# Patient Record
Sex: Male | Born: 1990 | Race: White | Smoking: Former smoker
Health system: Southern US, Community
[De-identification: ages and names within clinical notes are randomized; demographics above are authoritative.]

## PROBLEM LIST (undated history)

## (undated) DIAGNOSIS — N83209 Unspecified ovarian cyst, unspecified side: Secondary | ICD-10-CM

## (undated) DIAGNOSIS — E279 Disorder of adrenal gland, unspecified: Secondary | ICD-10-CM

## (undated) HISTORY — DX: Unspecified ovarian cyst, unspecified side: N83.209

## (undated) HISTORY — PX: TONSILECTOMY, ADENOIDECTOMY, BILATERAL MYRINGOTOMY AND TUBES: SHX2538

## (undated) HISTORY — PX: ADRENALECTOMY: SHX876

## (undated) HISTORY — DX: Disorder of adrenal gland, unspecified: E27.9

---

## 2012-10-16 ENCOUNTER — Other Ambulatory Visit: Payer: Self-pay | Admitting: Sports Medicine

## 2012-10-16 DIAGNOSIS — M25511 Pain in right shoulder: Secondary | ICD-10-CM

## 2012-10-24 ENCOUNTER — Ambulatory Visit
Admission: RE | Admit: 2012-10-24 | Discharge: 2012-10-24 | Disposition: A | Discharge status: Home / Self Care | Payer: BC Managed Care – PPO | Source: Ambulatory Visit | Attending: Sports Medicine | Admitting: Sports Medicine

## 2012-10-24 DIAGNOSIS — M25511 Pain in right shoulder: Secondary | ICD-10-CM

## 2012-10-24 MED ORDER — IOHEXOL 180 MG/ML  SOLN
15.0000 mL | Freq: Once | INTRAMUSCULAR | Status: AC | PRN
  Administered 2012-10-24: 15 mL via INTRA_ARTICULAR

## 2022-06-28 ENCOUNTER — Ambulatory Visit (INDEPENDENT_AMBULATORY_CARE_PROVIDER_SITE_OTHER): Payer: 59 | Admitting: Pulmonary Disease

## 2022-06-28 ENCOUNTER — Encounter: Payer: Self-pay | Admitting: Pulmonary Disease

## 2022-06-28 VITALS — BP 110/90 | HR 81 | Ht 68.0 in | Wt 238.4 lb

## 2022-06-28 DIAGNOSIS — R053 Chronic cough: Secondary | ICD-10-CM | POA: Diagnosis not present

## 2022-06-28 DIAGNOSIS — R0602 Shortness of breath: Secondary | ICD-10-CM | POA: Diagnosis not present

## 2022-06-28 NOTE — Progress Notes (Signed)
Synopsis: Referred in March 2024 for pulmonary nodule, abnormal CT chest by Sabra Heck, [place], [place]  Subjective:   PATIENT ID: David Moon GENDER: male DOB: [birth year], MRN: [id number]  Chief Complaint  Patient presents with   Consult    Pulmonary nodule    This is a 32 year old transgender male to male, current every day smoker, 3 packs of cigarettes per week.Patient was seen in the emergency department after having a endoscopy that was complicated by having a nosebleed after what sounds like having a nasal trumpet in during the anesthesia.  He was having trouble breathing therefore was seen in the ER.  Additional medical history includes a history of a cholecystectomy and ERCP in 2011, a laparoscopic adrenalectomy in 2018 history of tonsillectomy and adenoidectomy and myringotomy tubes.  Family history includes mother with atrial fibrillation, hyperlipidemia, sleep apnea, father with sleep apnea and a stroke.,  Maternal grandmother with lung cancer and a maternal grandfather with lung cancer.    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[place])    Ovarian cyst      Family History  Problem Relation Age of Onset   Hypertension Mother    Atrial fibrillation Mother    Lung cancer Maternal Grandmother    Lung cancer Paternal Grandmother      Past Surgical History:  Procedure Laterality Date   ADRENALECTOMY     TONSILECTOMY, ADENOIDECTOMY, BILATERAL MYRINGOTOMY AND TUBES      Social History   Socioeconomic History   Marital status: Divorced    Spouse name: Not on file   Number of children: Not on file   Years of education: Not on file   Highest education level: Not on file  Occupational History   Not on file  Tobacco Use   Smoking status: Every Day    Types: Cigarettes   Smokeless tobacco: Never   Tobacco comments:    Smokes 3 packs of cigarettes a week. 06/28/2022 Tay  Substance and Sexual Activity   Alcohol use: Not on file   Drug use: Not  on file   Sexual activity: Not on file  Other Topics Concern   Not on file  Social History Narrative   Not on file   Social Determinants of Health   Financial Resource Strain: Not on file  Food Insecurity: Not on file  Transportation Needs: Not on file  Physical Activity: Not on file  Stress: Not on file  Social Connections: Not on file  Intimate Partner Violence: Not on file     Not on File   Outpatient Medications Prior to Visit  Medication Sig Dispense Refill   buPROPion (WELLBUTRIN SR) 150 MG 12 hr tablet Take 150 mg by mouth 2 (two) times daily.     gabapentin (NEURONTIN) 300 MG capsule Take 300 mg by mouth 2 (two) times daily.     testosterone cypionate (DEPOTESTOSTERONE CYPIONATE) 200 MG/ML injection Inject 200 mg into the muscle every 14 (fourteen) days.     No facility-administered medications prior to visit.    Review of Systems  Constitutional:  Negative for chills, fever, malaise/fatigue and weight loss.  HENT:  Negative for hearing loss, sore throat and tinnitus.   Eyes:  Negative for blurred vision and double vision.  Respiratory:  Positive for cough. Negative for hemoptysis, sputum production, shortness of breath, wheezing and stridor.   Cardiovascular:  Negative for chest pain, palpitations, orthopnea, leg swelling and PND.  Gastrointestinal:  Negative for abdominal pain, constipation,  diarrhea, heartburn, nausea and vomiting.  Genitourinary:  Negative for dysuria, hematuria and urgency.  Musculoskeletal:  Positive for joint pain and myalgias.  Skin:  Negative for itching and rash.  Neurological:  Negative for dizziness, tingling, weakness and headaches.  Endo/Heme/Allergies:  Negative for environmental allergies. Does not bruise/bleed easily.  Psychiatric/Behavioral:  Negative for depression. The patient is not nervous/anxious and does not have insomnia.   All other systems reviewed and are negative.    Objective:  Physical Exam Vitals reviewed.   Constitutional:      General: She is not in acute distress.    Appearance: She is well-developed.  HENT:     Head: Normocephalic and atraumatic.  Eyes:     General: No scleral icterus.    Conjunctiva/sclera: Conjunctivae normal.     Pupils: Pupils are equal, round, and reactive to light.  Neck:     Vascular: No JVD.     Trachea: No tracheal deviation.  Cardiovascular:     Rate and Rhythm: Normal rate and regular rhythm.     Heart sounds: Normal heart sounds. No murmur heard. Pulmonary:     Effort: Pulmonary effort is normal. No tachypnea, accessory muscle usage or respiratory distress.     Breath sounds: No stridor. No wheezing, rhonchi or rales.  Abdominal:     General: There is no distension.     Palpations: Abdomen is soft.     Tenderness: There is no abdominal tenderness.  Musculoskeletal:        General: No tenderness.     Cervical back: Neck supple.  Lymphadenopathy:     Cervical: No cervical adenopathy.  Skin:    General: Skin is warm and dry.     Capillary Refill: Capillary refill takes less than 2 seconds.     Findings: No rash.  Neurological:     Mental Status: She is alert and oriented to person, place, and time.  Psychiatric:        Behavior: Behavior normal.      Vitals:   06/28/22 1319  BP: (!) 110/90  Pulse: 81  SpO2: 96%  Weight: 238 lb 6.4 oz (108.1 kg)  Height: 5\' 8"  (1.727 m)   96% on RA BMI Readings from Last 3 Encounters:  06/28/22 36.25 kg/m   Wt Readings from Last 3 Encounters:  06/28/22 238 lb 6.4 oz (108.1 kg)     CBC No results found for: "WBC", "RBC", "HGB", "HCT", "PLT", "MCV", "MCH", "MCHC", "RDW", "LYMPHSABS", "MONOABS", "EOSABS", "BASOSABS"   Chest Imaging: CT chest [facility] January 2024: Bilateral mosaicism, stable lung nodule since 2018. The patient's images have been independently reviewed by me.    Pulmonary Functions Testing Results:     No data to display          FeNO:   Pathology:    Echocardiogram:   Heart Catheterization:     Assessment & Plan:     ICD-10-CM   1. SOB (shortness of breath)  R06.02 Pulmonary Function Test    CT CHEST HIGH RESOLUTION    2. Chronic cough  R05.3 Pulmonary Function Test    CT CHEST HIGH RESOLUTION      Discussion:  This is a 32 year old transgender male to male.  Found to have abnormal CT imaging with bilateral mosaicism and a stable pulmonary nodule.  He has ongoing shortness of breath and cough.  Plan: I think next best step is to have pulmonary function test. Patient also needs to stop smoking. Since he has  bilateral mosaicism we could be dealing with small airway disease or bronchiolitis related to smoking. I think an HRCT of the chest would also help. We will have repeat CT imaging in 3 months after previous image which will put this in April 2024. Patient to follow-up with [place] in a couple of weeks after the HRCT and PFTs are complete.   Current Outpatient Medications:    buPROPion (WELLBUTRIN SR) 150 MG 12 hr tablet, Take 150 mg by mouth 2 (two) times daily., Disp: , Rfl:    gabapentin (NEURONTIN) 300 MG capsule, Take 300 mg by mouth 2 (two) times daily., Disp: , Rfl:    testosterone cypionate (DEPOTESTOSTERONE CYPIONATE) 200 MG/ML injection, Inject 200 mg into the muscle every 14 (fourteen) days., Disp: , Rfl:    Garner Nash, DO [facility] 06/28/2022 2:00 PM

## 2022-06-28 NOTE — Patient Instructions (Signed)
Thank you for visiting Dr. Valeta Harms at [facility]. Today we recommend the following:  Orders Placed This Encounter  Procedures   CT CHEST HIGH RESOLUTION   Pulmonary Function Test   Return in about 6 weeks (around 08/11/2022) for with APP or Dr. Valeta Harms, after PFTs and HRCT .    Please do your part to reduce the spread of COVID-19.

## 2022-07-20 ENCOUNTER — Ambulatory Visit
Admission: RE | Admit: 2022-07-20 | Discharge: 2022-07-20 | Disposition: A | Discharge status: Home / Self Care | Payer: 59 | Source: Ambulatory Visit | Attending: Internal Medicine | Admitting: Internal Medicine

## 2022-07-20 ENCOUNTER — Other Ambulatory Visit: Payer: Self-pay | Admitting: Internal Medicine

## 2022-07-20 DIAGNOSIS — M25562 Pain in left knee: Secondary | ICD-10-CM

## 2022-07-20 DIAGNOSIS — M25561 Pain in right knee: Secondary | ICD-10-CM

## 2022-07-27 ENCOUNTER — Ambulatory Visit
Admission: RE | Admit: 2022-07-27 | Discharge: 2022-07-27 | Disposition: A | Discharge status: Home / Self Care | Payer: 59 | Source: Ambulatory Visit | Attending: Pulmonary Disease | Admitting: Pulmonary Disease

## 2022-07-27 DIAGNOSIS — R053 Chronic cough: Secondary | ICD-10-CM

## 2022-07-27 DIAGNOSIS — R0602 Shortness of breath: Secondary | ICD-10-CM

## 2022-08-02 ENCOUNTER — Ambulatory Visit (INDEPENDENT_AMBULATORY_CARE_PROVIDER_SITE_OTHER): Payer: 59 | Admitting: Sports Medicine

## 2022-08-02 DIAGNOSIS — M357 Hypermobility syndrome: Secondary | ICD-10-CM

## 2022-08-02 DIAGNOSIS — M25561 Pain in right knee: Secondary | ICD-10-CM | POA: Diagnosis not present

## 2022-08-02 DIAGNOSIS — M2351 Chronic instability of knee, right knee: Secondary | ICD-10-CM | POA: Diagnosis not present

## 2022-08-02 DIAGNOSIS — M25562 Pain in left knee: Secondary | ICD-10-CM

## 2022-08-02 DIAGNOSIS — M222X1 Patellofemoral disorders, right knee: Secondary | ICD-10-CM | POA: Diagnosis not present

## 2022-08-02 DIAGNOSIS — G8929 Other chronic pain: Secondary | ICD-10-CM

## 2022-08-02 DIAGNOSIS — M222X2 Patellofemoral disorders, left knee: Secondary | ICD-10-CM

## 2022-08-02 NOTE — Progress Notes (Signed)
Right greater than left knee pain Years of pain Takes meloxicam/gabapentin for pain: no relief States the right knee swells and gives out Has not had surgery to either   Has tried bracing, but states it is uncomfortable Has not tried injection therapy Had PT for right knee years ago; but stated it made it worse

## 2022-08-02 NOTE — Progress Notes (Signed)
David Moon - 32 y.o. adult MRN [id number]  Date of birth: Jan 15, 1991  Office Visit Note: Visit Date: 08/02/2022 PCP: Collene Mares, PA Referred by: Collene Mares, PA  Subjective: Chief Complaint  Patient presents with   Right Knee - Pain   Left Knee - Pain   HPI: David Moon is a pleasant 32 y.o. adult who presents today for bilateral knee pain.  David Moon has had right greater than left knee pain.  Pain has been going on for years.  No specific injury.  Does note crepitus and crunching of bilateral knees, although right is worse than left.  Pain feels underneath the kneecap.  The right knee however will give out on David Moon and does feel unstable.  Has tried multiple different braces, did have a hinged knee brace in the past but this was 6+ years ago and no longer fits.  Has never had injections.  Did physical therapy but it has been about 5-6 years since then.  Does some stretches at home.  Pertinent ROS were reviewed with the patient and found to be negative unless otherwise specified above in HPI.   Assessment & Plan: Visit Diagnoses:  1. Chronic pain of both knees   2. Chronic instability of right knee   3. Patellofemoral arthralgia of both knees   4. Benign joint hypermobility    Plan: Discussed with David Moon likely etiology of his bilateral knee pain.  Does have patellofemoral arthralgia bilateral knees, however the right knee has had sensation of instability and has gave out multiple times over the past few years.  Discussed oral medication therapy, physical versus home therapy, advanced imaging, injection therapy.  States is agreeable to get started back on formalized physical therapy for patellofemoral pain and right knee stability.  Given the instability and giving way of the right knee, I would like to obtain an MRI of the right knee to rule out underlying internal pathology as well as evaluate the degree of chondromalacia or cartilage loss underneath the patellofemoral  joint.  May use over-the-counter anti-inflammatories as needed.  We will follow-up in 3 business days after MRI to review.  We did fit David Moon for a hinged knee brace for support of the right knee today.  *Additional treatment options: PRP, prolo inj  Follow-up: Return for in 3 business days after MRI.   Meds & Orders: No orders of the defined types were placed in this encounter.   Orders Placed This Encounter  Procedures   MR Knee Right w/o contrast   Ambulatory referral to Physical Therapy     Procedures: No procedures performed      Clinical History: No specialty comments available.  He reports that he has been smoking cigarettes. He has never used smokeless tobacco. No results for input(s): "HGBA1C", "LABURIC" in the last 8760 hours.  Objective:    Physical Exam  Gen: Well-appearing, in no acute distress; non-toxic CV: Well-perfused. Warm.  Resp: Breathing unlabored on room air; no wheezing. Psych: Fluid speech in conversation; appropriate affect; normal thought process Neuro: Sensation intact throughout. No gross coordination deficits.   Ortho Exam - Bilateral knees: Inspection of the knees demonstrates no effusion, redness or swelling.  There is notable patellar crepitus, right greater than left.  Mild TTP on the medial joint space and medial patellar facet of the right knee.  Positive patellar grind test.  There is some mild pain and guarding with McMurray's testing on medial side without reproducible click.  Negative Lachman, negative anterior/posterior drawer.  Strength 5/5 bilateral lower extremities.  Imaging:  *Independent review and interpretation of 4 view right knee and 4 view left knee x-ray from 07/20/2022 was performed by myself today.  There is preserved tibiofemoral joint spaces bilaterally. There is a small spur off the inferior aspect of the patella of bilateral knees.  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  No acute fracture or  significant joint effusion noted.  Narrative & Impression  CLINICAL DATA:  Pain   EXAM: RIGHT KNEE - COMPLETE 4+ VIEW   COMPARISON:  None Available.   FINDINGS: Minimal patellofemoral degenerative change with tiny osteophytes. No other significant degenerative change. No fracture dislocation. No joint effusion. No other abnormalities.   IMPRESSION: Minimal patellofemoral degenerative change.     Electronically Signed   By: Gerome Sam III M.D.   On: 07/21/2022 18:38    Narrative & Impression  CLINICAL DATA:  Pain   EXAM: LEFT KNEE - COMPLETE 4+ VIEW   COMPARISON:  None Available.   FINDINGS: Minimal patellofemoral degenerative change with a tiny inferior osteophyte on the lateral view. No fractures, dislocations, or other bony/soft tissue abnormality. No effusion.   IMPRESSION: Minimal patellofemoral degenerative change.     Electronically Signed   By: Gerome Sam III M.D.   On: 07/21/2022 18:38    Past Medical/Family/Surgical/Social History: Medications & Allergies reviewed per EMR, new medications updated. There are no problems to display for this patient.  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HCC)    Ovarian cyst    Family History  Problem Relation Age of Onset   Hypertension Mother    Atrial fibrillation Mother    Lung cancer Maternal Grandmother    Lung cancer Paternal Grandmother    Past Surgical History:  Procedure Laterality Date   ADRENALECTOMY     TONSILECTOMY, ADENOIDECTOMY, BILATERAL MYRINGOTOMY AND TUBES     Social History   Occupational History   Not on file  Tobacco Use   Smoking status: Every Day    Types: Cigarettes   Smokeless tobacco: Never   Tobacco comments:    Smokes 3 packs of cigarettes a week. 06/28/2022 Tay  Substance and Sexual Activity   Alcohol use: Not on file   Drug use: Not on file   Sexual activity: Not on file

## 2022-08-12 ENCOUNTER — Ambulatory Visit (INDEPENDENT_AMBULATORY_CARE_PROVIDER_SITE_OTHER): Payer: 59 | Admitting: Pulmonary Disease

## 2022-08-12 ENCOUNTER — Telehealth: Payer: Self-pay | Admitting: Pulmonary Disease

## 2022-08-12 DIAGNOSIS — R0602 Shortness of breath: Secondary | ICD-10-CM

## 2022-08-12 DIAGNOSIS — R053 Chronic cough: Secondary | ICD-10-CM

## 2022-08-12 LAB — PULMONARY FUNCTION TEST
DL/VA % pred: 136 %
DL/VA: 6.7 ml/min/mmHg/L
DLCO cor % pred: 98 %
DLCO cor: 28.96 ml/min/mmHg
DLCO unc % pred: 98 %
DLCO unc: 28.96 ml/min/mmHg
FEF 25-75 Post: 3.85 L/sec
FEF 25-75 Pre: 3.43 L/sec
FEF2575-%Change-Post: 12 %
FEF2575-%Pred-Post: 95 %
FEF2575-%Pred-Pre: 84 %
FEV1-%Change-Post: 4 %
FEV1-%Pred-Post: 74 %
FEV1-%Pred-Pre: 71 %
FEV1-Post: 3.01 L
FEV1-Pre: 2.88 L
FEV1FVC-%Change-Post: 1 %
FEV1FVC-%Pred-Pre: 106 %
FEV6-%Change-Post: 2 %
FEV6-%Pred-Post: 70 %
FEV6-%Pred-Pre: 68 %
FEV6-Post: 3.4 L
FEV6-Pre: 3.33 L
FEV6FVC-%Pred-Post: 101 %
FEV6FVC-%Pred-Pre: 101 %
FVC-%Change-Post: 2 %
FVC-%Pred-Post: 69 %
FVC-%Pred-Pre: 67 %
FVC-Post: 3.43 L
FVC-Pre: 3.33 L
Post FEV1/FVC ratio: 88 %
Post FEV6/FVC ratio: 100 %
Pre FEV1/FVC ratio: 86 %
Pre FEV6/FVC Ratio: 100 %
RV % pred: 100 %
RV: 1.53 L
TLC % pred: 83 %
TLC: 5.25 L

## 2022-08-12 NOTE — Progress Notes (Signed)
Full PFT Performed Today  

## 2022-08-12 NOTE — Telephone Encounter (Signed)
Pt called requesting the results of the HRCT which was performed 4/16. Dr. Tonia Brooms, please advise.

## 2022-08-12 NOTE — Patient Instructions (Signed)
Full PFT Performed Today  

## 2022-08-15 NOTE — Progress Notes (Deleted)
NEW PATIENT Date of Service/Encounter:  08/15/22 Referring provider: Collene Mares, PA Primary care provider: Collene Mares, [place]  Subjective:  David Moon is a 32 y.o. adult with a PMHx of adrenal malignancy, pneumothorax, history of pancreatitis, gender dysphoria presenting today for evaluation of rash. History obtained from: chart review and {Persons; PED relatives w/patient:19415::"patient"}.   Rash: started ***, occurring *** Associates *** Denies other systemic symptoms including no respiratory, gastrointestinal or cardiovascular distress. *** changes to personal care products, detergents, diet, etc Therapies tried: *** Potential triggers: *** Does *** associate fever, joint pain, joint swelling, weight loss.  Lesions resolve in ***  *** bruising on resolution. *** recent illness. Pictures reviewed ***  Of note, has been referred to pulmonary for lung nodules, SOB on exertion and hx of pneumothorax so we have not addressed her respiratory complaints today.  Other allergy screening: Asthma: {Blank single:19197::"yes","no"} Rhino conjunctivitis: {Blank single:19197::"yes","no"} Food allergy: {Blank single:19197::"yes","no"} Medication allergy: {Blank single:19197::"yes","no"} Hymenoptera allergy: {Blank single:19197::"yes","no"} Urticaria: {Blank single:19197::"yes","no"} Eczema:{Blank single:19197::"yes","no"} History of recurrent infections suggestive of immunodeficency: {Blank single:19197::"yes","no"} ***Vaccinations are up to date.   Past Medical History: 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[facility]CC)    Ovarian cyst    Medication List:  Current Outpatient Medications  Medication Sig Dispense Refill   buPROPion (WELLBUTRIN SR) 150 MG 12 hr tablet Take 150 mg by mouth 2 (two) times daily.     gabapentin (NEURONTIN) 300 MG capsule Take 300 mg by mouth 2 (two) times daily.     testosterone cypionate (DEPOTESTOSTERONE CYPIONATE)  200 MG/ML injection Inject 200 mg into the muscle every 14 (fourteen) days.     No current facility-administered medications for this visit.   Known Allergies:  Not on File Past Surgical History: Past Surgical History:  Procedure Laterality Date   ADRENALECTOMY     TONSILECTOMY, ADENOIDECTOMY, BILATERAL MYRINGOTOMY AND TUBES     Family History: Family History  Problem Relation Age of Onset   Hypertension Mother    Atrial fibrillation Mother    Lung cancer Maternal Grandmother    Lung cancer Paternal Grandmother    Social History: [place] lives ***.   ROS:  All other systems negative except as noted per HPI.  Objective:  There were no vitals taken for this visit. There is no height or weight on file to calculate BMI. Physical Exam:  General Appearance:  Alert, cooperative, no distress, appears stated age  Head:  Normocephalic, without obvious abnormality, atraumatic  Eyes:  Conjunctiva clear, EOM's intact  Nose: Nares normal, {Blank multiple:19196:a:"***","hypertrophic turbinates","normal mucosa","no visible anterior polyps","septum midline"}  Throat: Lips, tongue normal; teeth and gums normal, {Blank multiple:19196:a:"***","normal posterior oropharynx","tonsils 2+","tonsils 3+","no tonsillar exudate","+ cobblestoning"}  Neck: Supple, symmetrical  Lungs:   {Blank multiple:19196:a:"***","clear to auscultation bilaterally","end-expiratory wheezing","wheezing throughout"}, Respirations unlabored, {Blank multiple:19196:a:"***","no coughing","intermittent dry coughing"}  Heart:  {Blank multiple:19196:a:"***","regular rate and rhythm","no murmur"}, Appears well perfused  Extremities: No edema  Skin: {Blank multiple:19196:a:"***","Skin color, texture, turgor normal","no rashes or lesions on visualized portions of skin"}  Neurologic: No gross deficits     Diagnostics: Spirometry:  Tracings reviewed. His effort: {Blank single:19197::"Good reproducible efforts.","It was hard to get  consistent efforts and there is a question as to whether this reflects a maximal maneuver.","Poor effort, data can not be interpreted.","Variable effort-results affected.","decent for first attempt at spirometry."} FVC: ***L (pre), ***L  (post) FEV1: ***L, ***% predicted (pre), ***L, ***% predicted (post) FEV1/FVC ratio: *** (pre), *** (post) Interpretation: {Blank single:19197::"Spirometry consistent with mild  obstructive disease","Spirometry consistent with moderate obstructive disease","Spirometry consistent with severe obstructive disease","Spirometry consistent with possible restrictive disease","Spirometry consistent with mixed obstructive and restrictive disease","Spirometry uninterpretable due to technique","Spirometry consistent with normal pattern","No overt abnormalities noted given today's efforts"} with *** bronchodilator response  Skin Testing: {Blank single:19197::"Select foods","Environmental allergy panel","Environmental allergy panel and select foods","Food allergy panel","None","Deferred due to recent antihistamines use"}. *** Adequate controls. Results discussed with patient/family.   {Blank single:19197::"Allergy testing results were read and interpreted by myself, documented by clinical staff."," "}  Assessment and Plan  ***  {Blank single:19197::"This note in its entirety was forwarded to the Provider who requested this consultation."}  Thank you for your kind referral. I appreciate the opportunity to take part in Christos's care. Please do not hesitate to contact me with questions.***  Sincerely,  Tonny Bollman, MD [facility]

## 2022-08-16 ENCOUNTER — Ambulatory Visit: Payer: Self-pay | Admitting: Internal Medicine

## 2022-08-17 ENCOUNTER — Ambulatory Visit: Payer: 59 | Admitting: Sports Medicine

## 2022-08-18 ENCOUNTER — Ambulatory Visit: Payer: 59 | Admitting: Pulmonary Disease

## 2022-08-18 ENCOUNTER — Encounter: Payer: Self-pay | Admitting: Sports Medicine

## 2022-08-18 ENCOUNTER — Encounter: Payer: Self-pay | Admitting: Pulmonary Disease

## 2022-08-18 VITALS — BP 120/80 | HR 75 | Ht 68.0 in | Wt 239.0 lb

## 2022-08-18 DIAGNOSIS — R0602 Shortness of breath: Secondary | ICD-10-CM | POA: Diagnosis not present

## 2022-08-18 DIAGNOSIS — Z6836 Body mass index (BMI) 36.0-36.9, adult: Secondary | ICD-10-CM

## 2022-08-18 DIAGNOSIS — R053 Chronic cough: Secondary | ICD-10-CM | POA: Diagnosis not present

## 2022-08-18 MED ORDER — FLUTICASONE FUROATE-VILANTEROL 200-25 MCG/ACT IN AEPB: 1.0000 | INHALATION_SPRAY | Freq: Every day | RESPIRATORY_TRACT | 3 refills | Status: AC

## 2022-08-18 NOTE — Patient Instructions (Signed)
Thank you for visiting Dr. Tonia Brooms at [facility]. Today we recommend the following:  Meds ordered this encounter  Medications   fluticasone furoate-vilanterol (BREO ELLIPTA) 200-25 MCG/ACT AEPB    Sig: Inhale 1 puff into the lungs daily.    Dispense:  60 each    Refill:  3    Return in about 1 year (around 08/18/2023), or if symptoms worsen or fail to improve, for with APP.    Please do your part to reduce the spread of COVID-19.

## 2022-08-18 NOTE — Progress Notes (Signed)
Synopsis: Referred in March 2024 for pulmonary nodule, abnormal CT chest by Hyacinth Meeker, [place], [place]  Subjective:   PATIENT ID: David Moon GENDER: male DOB: Oct 08, 1990, MRN: [id number]  Chief Complaint  Patient presents with   Follow-up    F/up on CT and PFT    This is a 31 year old transgender male to male, current every day smoker, 3 packs of cigarettes per week.Patient was seen in the emergency department after having a endoscopy that was complicated by having a nosebleed after what sounds like having a nasal trumpet in during the anesthesia.  He was having trouble breathing therefore was seen in the ER.  Additional medical history includes a history of a cholecystectomy and ERCP in 2011, a laparoscopic adrenalectomy in 2018 history of tonsillectomy and adenoidectomy and myringotomy tubes.  Family history includes mother with atrial fibrillation, hyperlipidemia, sleep apnea, father with sleep apnea and a stroke.,  Maternal grandmother with lung cancer and a maternal grandfather with lung cancer.  OV 08/18/2022: Here today for follow-up after PFTs.  Has mildly reduced spirometry, some mild mosaicism on imaging from CT.  Repeat CT also still suggested this.  Patient wonder if he has asthma symptoms that have been driving some of the shortness of breath.  Thankfully they have quit smoking.  Some of this may be related to bronchiolitis such as respiratory bronchiolitis with small airway disease but thankfully he is quit smoking.  Did not tolerate previous inhalers.  But I do think a trial of inhaler may help him with bronchodilation.    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HCC)    Ovarian cyst      Family History  Problem Relation Age of Onset   Hypertension Mother    Atrial fibrillation Mother    Lung cancer Maternal Grandmother    Lung cancer Paternal Grandmother      Past Surgical History:  Procedure Laterality Date   ADRENALECTOMY     TONSILECTOMY,  ADENOIDECTOMY, BILATERAL MYRINGOTOMY AND TUBES      Social History   Socioeconomic History   Marital status: Divorced    Spouse name: Not on file   Number of children: Not on file   Years of education: Not on file   Highest education level: Not on file  Occupational History   Not on file  Tobacco Use   Smoking status: Former    Types: Cigarettes    Quit date: 06/28/2022    Years since quitting: 0.1   Smokeless tobacco: Never  Substance and Sexual Activity   Alcohol use: Not on file   Drug use: Not on file   Sexual activity: Not on file  Other Topics Concern   Not on file  Social History Narrative   Not on file   Social Determinants of Health   Financial Resource Strain: Not on file  Food Insecurity: Not on file  Transportation Needs: Not on file  Physical Activity: Not on file  Stress: Not on file  Social Connections: Not on file  Intimate Partner Violence: Not on file     Not on File   Outpatient Medications Prior to Visit  Medication Sig Dispense Refill   buPROPion (WELLBUTRIN SR) 150 MG 12 hr tablet Take 150 mg by mouth 2 (two) times daily.     gabapentin (NEURONTIN) 300 MG capsule Take 300 mg by mouth 2 (two) times daily.     testosterone cypionate (DEPOTESTOSTERONE CYPIONATE) 200 MG/ML injection Inject 200 mg into  the muscle every 14 (fourteen) days.     No facility-administered medications prior to visit.    Review of Systems  Constitutional:  Negative for chills, fever, malaise/fatigue and weight loss.  HENT:  Negative for hearing loss, sore throat and tinnitus.   Eyes:  Negative for blurred vision and double vision.  Respiratory:  Positive for cough and shortness of breath. Negative for hemoptysis, sputum production, wheezing and stridor.   Cardiovascular:  Negative for chest pain, palpitations, orthopnea, leg swelling and PND.  Gastrointestinal:  Negative for abdominal pain, constipation, diarrhea, heartburn, nausea and vomiting.  Genitourinary:   Negative for dysuria, hematuria and urgency.  Musculoskeletal:  Negative for joint pain and myalgias.  Skin:  Negative for itching and rash.  Neurological:  Negative for dizziness, tingling, weakness and headaches.  Endo/Heme/Allergies:  Negative for environmental allergies. Does not bruise/bleed easily.  Psychiatric/Behavioral:  Negative for depression. The patient is not nervous/anxious and does not have insomnia.   All other systems reviewed and are negative.    Objective:  Physical Exam Vitals reviewed.  Constitutional:      General: He is not in acute distress.    Appearance: He is well-developed. He is obese.  HENT:     Head: Normocephalic and atraumatic.  Eyes:     General: No scleral icterus.    Conjunctiva/sclera: Conjunctivae normal.     Pupils: Pupils are equal, round, and reactive to light.  Neck:     Vascular: No JVD.     Trachea: No tracheal deviation.  Cardiovascular:     Rate and Rhythm: Normal rate and regular rhythm.     Heart sounds: Normal heart sounds. No murmur heard. Pulmonary:     Effort: Pulmonary effort is normal. No tachypnea, accessory muscle usage or respiratory distress.     Breath sounds: No stridor. No wheezing, rhonchi or rales.  Abdominal:     General: There is no distension.     Palpations: Abdomen is soft.     Tenderness: There is no abdominal tenderness.  Musculoskeletal:        General: No tenderness.     Cervical back: Neck supple.  Lymphadenopathy:     Cervical: No cervical adenopathy.  Skin:    General: Skin is warm and dry.     Capillary Refill: Capillary refill takes less than 2 seconds.     Findings: No rash.  Neurological:     Mental Status: He is alert and oriented to person, place, and time.  Psychiatric:        Behavior: Behavior normal.      Vitals:   08/18/22 1515  BP: 120/80  Pulse: 75  SpO2: 98%  Weight: 239 lb (108.4 kg)  Height: 5\' 8"  (1.727 m)   98% on RA BMI Readings from Last 3 Encounters:  08/18/22  36.34 kg/m  08/12/22 37.34 kg/m  06/28/22 36.25 kg/m   Wt Readings from Last 3 Encounters:  08/18/22 239 lb (108.4 kg)  08/12/22 238 lb 6.4 oz (108.1 kg)  06/28/22 238 lb 6.4 oz (108.1 kg)     CBC No results found for: "WBC", "RBC", "HGB", "HCT", "PLT", "MCV", "MCH", "MCHC", "RDW", "LYMPHSABS", "MONOABS", "EOSABS", "BASOSABS"   Chest Imaging: CT chest [facility] January 2024: Bilateral mosaicism, stable lung nodule since 2018. The patient's images have been independently reviewed by me.    CT chest without contrast: Evidence of mild small airway disease. The patient's images have been independently reviewed by me.    Pulmonary Functions Testing  Results:    Latest Ref Rng & Units 08/12/2022    2:17 PM  PFT Results  FVC-Pre L 3.33   FVC-Predicted Pre % 67   FVC-Post L 3.43   FVC-Predicted Post % 69   Pre FEV1/FVC % % 86   Post FEV1/FCV % % 88   FEV1-Pre L 2.88   FEV1-Predicted Pre % 71   FEV1-Post L 3.01   DLCO uncorrected ml/min/mmHg 28.96   DLCO UNC% % 98   DLCO corrected ml/min/mmHg 28.96   DLCO COR %Predicted % 98   DLVA Predicted % 136   TLC L 5.25   TLC % Predicted % 83   RV % Predicted % 100     FeNO:   Pathology:   Echocardiogram:   Heart Catheterization:     Assessment & Plan:     ICD-10-CM   1. SOB (shortness of breath)  R06.02     2. Chronic cough  R05.3     3. BMI 36.0-36.9,adult  Z68.36       Discussion:  This is a 32 year old gentleman, found to have abnormal CT imaging with bilateral mosaicism, stable pulmonary nodules.  Repeat HRCT still shows small evidence of small airway disease.  PFTs reviewed today in the office with reduced spirometry, reduced ERV.  Normal TLC and DLCO.  Overall symptoms have improved since smoking cessation.  Plan: Patient was counseled on smoking cessation thankfully they have quit smoking. Will start Breo Ellipta. Return to clinic in 1 year or as needed.   Current Outpatient Medications:     buPROPion (WELLBUTRIN SR) 150 MG 12 hr tablet, Take 150 mg by mouth 2 (two) times daily., Disp: , Rfl:    fluticasone furoate-vilanterol (BREO ELLIPTA) 200-25 MCG/ACT AEPB, Inhale 1 puff into the lungs daily., Disp: 60 each, Rfl: 3   gabapentin (NEURONTIN) 300 MG capsule, Take 300 mg by mouth 2 (two) times daily., Disp: , Rfl:    testosterone cypionate (DEPOTESTOSTERONE CYPIONATE) 200 MG/ML injection, Inject 200 mg into the muscle every 14 (fourteen) days., Disp: , Rfl:    David Igo, DO [facility] 08/18/2022 3:53 PM

## 2022-08-22 ENCOUNTER — Ambulatory Visit
Admission: RE | Admit: 2022-08-22 | Discharge: 2022-08-22 | Disposition: A | Discharge status: Home / Self Care | Payer: 59 | Source: Ambulatory Visit | Attending: Sports Medicine | Admitting: Sports Medicine

## 2022-08-22 DIAGNOSIS — G8929 Other chronic pain: Secondary | ICD-10-CM

## 2022-08-24 ENCOUNTER — Ambulatory Visit: Payer: 59 | Admitting: Sports Medicine

## 2022-08-24 DIAGNOSIS — M23006 Cystic meniscus, unspecified meniscus, right knee: Secondary | ICD-10-CM

## 2022-08-24 DIAGNOSIS — G8929 Other chronic pain: Secondary | ICD-10-CM | POA: Diagnosis not present

## 2022-08-24 DIAGNOSIS — M2351 Chronic instability of knee, right knee: Secondary | ICD-10-CM

## 2022-08-24 DIAGNOSIS — M357 Hypermobility syndrome: Secondary | ICD-10-CM

## 2022-08-24 DIAGNOSIS — M25561 Pain in right knee: Secondary | ICD-10-CM | POA: Diagnosis not present

## 2022-08-24 MED ORDER — BUPIVACAINE HCL 0.25 % IJ SOLN
2.0000 mL | INTRAMUSCULAR | Status: AC | PRN
Start: 2022-08-24 — End: 2022-08-24
  Administered 2022-08-24: 2 mL via INTRA_ARTICULAR

## 2022-08-24 MED ORDER — METHYLPREDNISOLONE ACETATE 40 MG/ML IJ SUSP
80.0000 mg | INTRAMUSCULAR | Status: AC | PRN
Start: 2022-08-24 — End: 2022-08-24
  Administered 2022-08-24: 80 mg via INTRA_ARTICULAR

## 2022-08-24 MED ORDER — LIDOCAINE HCL 1 % IJ SOLN
2.0000 mL | INTRAMUSCULAR | Status: AC | PRN
Start: 2022-08-24 — End: 2022-08-24
  Administered 2022-08-24: 2 mL

## 2022-08-24 NOTE — Progress Notes (Signed)
David Moon - 32 y.o. adult MRN [id number]  Date of birth: 06/18/1990  Office Visit Note: Visit Date: 08/24/2022 PCP: Collene Mares, PA Referred by: Collene Mares, PA  Subjective: Chief Complaint  Patient presents with   Right Knee - Pain   HPI: David Moon is a pleasant 32 y.o. adult who presents today for follow-up of acute on chronic right knee pain and instability.  David Moon is still having pain as well as some instability of the right knee.  States that it feels like the knee will hyperextend.  We did review MRI today which shows parameniscal cyst without any evidence of significant meniscal tearing.  He does tell me today that he has the same condition a parameniscal cyst on the contralateral knee.  Has a strong personal and family history of hypermobility.  Has had subluxing events with his shoulders as well.  He is doing physical [facility] at [facility] in [place] but is not finding this beneficial as there is 3 patient's to 1 therapist.  Continues with meloxicam 15 mg daily without significant relief.  Pertinent ROS were reviewed with the patient and found to be negative unless otherwise specified above in HPI.   Assessment & Plan: Visit Diagnoses:  1. Chronic instability of right knee   2. Perimeniscal cyst of right knee   3. Benign joint hypermobility    Plan: Discussed with David Moon today that his MRI does show a parameniscal cyst without evidence of gross meniscal tearing.  He does have this on the contralateral knee as well per his report.  I do think that this is more of a sequelae of his joint hypermobility as he has had issues with both the knee and shoulder instability.  We did send a new referral per his request to work on the and joint strengthening as well as stability.  I would like to keep him in his knee brace when he is up on his feet and performing physical activity to prevent episodes of the knee giving out.  To help combat his immediate pain we did proceed  with a corticosteroid injection into the right knee and the parameniscal cyst in the joint.  May use ice, Tylenol as well as meloxicam for any postinjection pain.  Did send information for body.  For joint hypermobility to see if this is something that may be beneficial for him.  We will follow-up in about 6 weeks from physical therapy to see what sort of improvement he is getting.  Given his joint hypermobility, I have a low threshold for surgical treatments.  However if his pain does not significantly improve, we could always consider referral to my partner Dr. Steward Moon for knee arthroscopy, although would hold for this given his generalized joint hypermobility.  Follow-up: Return in about 6 weeks (around 10/05/2022) for after starting PT in [place].   Meds & Orders: No orders of the defined types were placed in this encounter.   Orders Placed This Encounter  Procedures   Ambulatory referral to Physical Therapy     Procedures: Large Joint Inj: R knee on 08/24/2022 5:41 PM Indications: pain and diagnostic evaluation Details: 22 G 1.5 in needle, anterolateral approach Medications: 2 mL lidocaine 1 %; 2 mL bupivacaine 0.25 %; 80 mg methylPREDNISolone acetate 40 MG/ML Outcome: tolerated well, no immediate complications  Knee Injection, Right: After discussion on risks/benefits/indications, informed verbal consent was obtained and a timeout was performed, patient was seated on exam table. The patient's knee was prepped with Betadine  and alcohol swab and utilizing anterolateral approach, the patient's knee was injected intraarticularly with 2:2:2 lidocaine 1%:bupivicaine 0.25%:depomedrol. Patient tolerated the procedure well without immediate complications.  Procedure, treatment alternatives, risks and benefits explained, specific risks discussed. Consent was given by the patient. Immediately prior to procedure a time out was called to verify the correct patient, procedure, equipment, support  staff and site/side marked as required. Patient was prepped and draped in the usual sterile fashion.          Clinical History: No specialty comments available.  He reports that he quit smoking about 8 weeks ago. His smoking use included cigarettes. He has never used smokeless tobacco. No results for input(s): "HGBA1C", "LABURIC" in the last 8760 hours.  Objective:   Vital Signs: There were no vitals taken for this visit.  Physical Exam  Gen: Well-appearing, in no acute distress; non-toxic CV: Regular Rate. Well-perfused. Warm.  Resp: Breathing unlabored on room air; no wheezing. Psych: Fluid speech in conversation; appropriate affect; normal thought process Neuro: Sensation intact throughout. No gross coordination deficits.   Ortho Exam - Right knee: No significant effusion, range of motion from -3 to 130 degrees.  There is some generalized mobility with patellar tilt laterally.  Mild TTP on the medial and lateral joint line.  Imaging:  MR Knee Right w/o contrast CLINICAL DATA:  Chronic knee pain. Popping. Clicking. Posterior knee pain and instability for 2 years.  EXAM: MRI OF THE RIGHT KNEE WITHOUT CONTRAST  TECHNIQUE: Multiplanar, multisequence MR imaging of the knee was performed. No intravenous contrast was administered.  COMPARISON:  Right knee radiographs 07/20/2022  FINDINGS: MENISCI  Medial meniscus:  Intact.  Lateral meniscus: There is abnormal increased proton density signal within the majority of the anterior horn of the lateral meniscus with a portion that is horizontal and linear and extends through the anterior wall of the anterior horn (sagittal series 7 images 739). No definite tear is seen extending through an articular surface of the lateral meniscus. There are multilocular cysts bordering the anterior wall of the anterior horn of the lateral meniscus diffusely (sagittal series 6 images 7 through 11 consistent with parameniscal cysts measuring up  to approximately 15 x 13 x 7 mm (transverse by AP by craniocaudal).  LIGAMENTS  Cruciates: The ACL and PCL are intact.  Collaterals: The medial collateral ligament is intact. The fibular collateral ligament, biceps femoris tendon, iliotibial band, and popliteus tendon are intact.  CARTILAGE  Patellofemoral:  Intact.  Medial:  Intact.  Lateral:  Intact.  Joint: Tinyjoint effusion. Normal Hoffa's fat pad. No plical thickening.  Popliteal Fossa:  Tiny Baker's cyst.  Extensor Mechanism:  Intact quadriceps tendon and patellar tendon.  Bones:  No acute fracture or dislocation.  Other: Minimal degenerative spurring at the peripheral aspect of the lateral compartment.  IMPRESSION: 1. There is horizontal linear increased signal within the anterior horn of the lateral meniscus extending through the anterior wall, however no tear is seen extending through an articular surface of the lateral meniscus. Parameniscal cysts bordering the anterior wall of the anterior horn of the lateral meniscus measuring up to 15 x 13 x 7 mm. 2. Tiny joint effusion. Tiny Baker's cyst.  Electronically Signed   By: Neita Garnet M.D.   On: 08/24/2022 08:41    Past Medical/Family/Surgical/Social History: Medications & Allergies reviewed per EMR, new medications updated. There are no problems to display for this patient.  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HCC)  Ovarian cyst    Family History  Problem Relation Age of Onset   Hypertension Mother    Atrial fibrillation Mother    Lung cancer Maternal Grandmother    Lung cancer Paternal Grandmother    Past Surgical History:  Procedure Laterality Date   ADRENALECTOMY     TONSILECTOMY, ADENOIDECTOMY, BILATERAL MYRINGOTOMY AND TUBES     Social History   Occupational History   Not on file  Tobacco Use   Smoking status: Former    Types: Cigarettes    Quit date: 06/28/2022    Years since quitting: 0.1    Smokeless tobacco: Never  Substance and Sexual Activity   Alcohol use: Not on file   Drug use: Not on file   Sexual activity: Not on file

## 2022-09-14 ENCOUNTER — Ambulatory Visit: Payer: 59 | Attending: Sports Medicine | Admitting: Physical Therapy

## 2022-09-14 DIAGNOSIS — R262 Difficulty in walking, not elsewhere classified: Secondary | ICD-10-CM | POA: Insufficient documentation

## 2022-09-14 DIAGNOSIS — M25661 Stiffness of right knee, not elsewhere classified: Secondary | ICD-10-CM | POA: Insufficient documentation

## 2022-09-14 DIAGNOSIS — M25561 Pain in right knee: Secondary | ICD-10-CM | POA: Insufficient documentation

## 2022-09-14 DIAGNOSIS — M6281 Muscle weakness (generalized): Secondary | ICD-10-CM | POA: Insufficient documentation

## 2022-09-15 ENCOUNTER — Other Ambulatory Visit: Payer: Self-pay

## 2022-09-15 ENCOUNTER — Encounter: Payer: Self-pay | Admitting: Physical Therapy

## 2022-09-15 ENCOUNTER — Ambulatory Visit: Payer: 59 | Admitting: Physical Therapy

## 2022-09-15 DIAGNOSIS — M25561 Pain in right knee: Secondary | ICD-10-CM | POA: Diagnosis present

## 2022-09-15 DIAGNOSIS — R262 Difficulty in walking, not elsewhere classified: Secondary | ICD-10-CM

## 2022-09-15 DIAGNOSIS — M25661 Stiffness of right knee, not elsewhere classified: Secondary | ICD-10-CM | POA: Diagnosis present

## 2022-09-15 DIAGNOSIS — M6281 Muscle weakness (generalized): Secondary | ICD-10-CM

## 2022-09-15 NOTE — Therapy (Signed)
OUTPATIENT PHYSICAL THERAPY LOWER EXTREMITY EVALUATION   Patient Name: David Moon MRN: [id number] DOB:25-May-1990, 32 y.o., adult Today's Date: 09/15/2022  END OF SESSION:  PT End of Session - 09/15/22 1411     Visit Number 1    Number of Visits 8    Date for PT Re-Evaluation 11/10/22    Authorization Type [employer]    PT Start Time 1410   late arrival   PT Stop Time 1445    PT Time Calculation (min) 35 min    Activity Tolerance Patient tolerated treatment well             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HCC)    Ovarian cyst    Past Surgical History:  Procedure Laterality Date   ADRENALECTOMY     TONSILECTOMY, ADENOIDECTOMY, BILATERAL MYRINGOTOMY AND TUBES     There are no problems to display for this patient.   PCP: Hyacinth Meeker, [place], [place]  REFERRING PROVIDER: Madelyn Brunner, DO  REFERRING DIAG:  M23.51 (ICD-10-CM) - Chronic instability of right knee    THERAPY DIAG:  Acute pain of right knee  Stiffness of right knee, not elsewhere classified  Muscle weakness (generalized)  Difficulty in walking, not elsewhere classified  Rationale for Evaluation and Treatment: Rehabilitation  ONSET DATE: 5 years  SUBJECTIVE:   SUBJECTIVE STATEMENT: Pt reports he has hypermobility and has fluid built behind his knee cap with bone spurs. Pt states R knee gives out a lot. Pt did get another referral for a collapsed disc in his lower back. Is planning for imaging for upper back. Pt states he does tend to hyperextend knee. Tries to wear a brace. Did PT at another place in the past month.   PERTINENT HISTORY: None in history  PAIN:  Are you having pain? Yes: NPRS scale: 0 at rest, 10 at worst /10 Pain location: Under R knee cap Pain description: sharp pain Aggravating factors: Walking, prolonged activity/fatigue Relieving factors: brace  PRECAUTIONS: None  WEIGHT BEARING RESTRICTIONS: No  FALLS:  Has patient fallen in last 6 months?  Yes. 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 There is horizontal linear increased signal within the anterior horn of the lateral meniscus extending through the anterior wall, however no tear is seen extending through an articular surface of the lateral meniscus. Parameniscal cysts bordering the anterior wall of the anterior horn of the lateral meniscus measuring up to 15 x 13 x 7 mm. 2. Tiny joint effusion. Tiny Baker's cyst.  PATIENT SURVEYS:  FOTO 61; predicted 66  COGNITION: Overall cognitive status: Within functional limits for tasks assessed     SENSATION: WFL back of leg into toes on R  EDEMA:  None  MUSCLE LENGTH: Hamstrings: Right 80 deg; Left 70 deg Thomas test: Right 10 deg; Left 10 deg  POSTURE: rounded shoulders, forward head, and increased thoracic kyphosis  PALPATION: No overt TTP  LOWER EXTREMITY ROM:  Active ROM Right eval Left eval  Hip flexion    Hip extension    Hip abduction    Hip adduction    Hip internal rotation  Hip external rotation    Knee flexion [facility] WFL  Knee extension 5 5  Ankle dorsiflexion    Ankle plantarflexion    Ankle inversion    Ankle eversion     (Blank rows = not tested)  LOWER EXTREMITY MMT:  MMT Right eval Left eval  Hip flexion 4 5  Hip extension 4 4  Hip abduction 4- 4-  Hip adduction    Hip internal rotation 4+ 4  Hip external rotation 4 4+  Knee flexion 4 5  Knee extension 4 5  Ankle dorsiflexion    Ankle plantarflexion    Ankle inversion    Ankle eversion     (Blank rows = not tested)  LOWER EXTREMITY SPECIAL TESTS:  Did not  assess  FUNCTIONAL TESTS:  SLS: Left 24 sec, right 1 min (locks knee back) 5x STS 13 sec Single leg sit<>stand unsteady bilat (L worse than R)  GAIT: Distance walked: 100' Assistive device utilized: None Level of assistance: Complete Independence Comments: knee hyperextension during stance phase   TODAY'S TREATMENT:                                                                                                                              DATE: 09/15/22 See HEP below    PATIENT EDUCATION:  Education details: Exam findings, POC, initial HEP Person educated: Patient Education method: Explanation, Demonstration, and Handouts Education comprehension: verbalized understanding, returned demonstration, and needs further education  HOME EXERCISE PROGRAM: Access Code: [email or web address] URL: [email or web address] Date: 09/15/2022 Prepared by: Vernon Prey April Kirstie Peri  Exercises - Forward Step Down Touch with Heel  - 1 x daily - 7 x weekly - 2 sets - 10 reps - 3 sec hold - Primal Push Up  - 1 x daily - 7 x weekly - 3 sets - 30 sec hold - Wall Squat  - 1 x daily - 7 x weekly - 2 sets - 10 reps - 5 sec hold - Isometric Gluteus Medius at Wall  - 1 x daily - 7 x weekly - 2 sets - 10 reps - 5 sec hold  ASSESSMENT:  CLINICAL IMPRESSION: Patient is a 32 y.o. M who was seen today for physical therapy evaluation and treatment for R knee instability. Assessment significant for bilat knee instability with R > L quad weakness and decreased eccentric control and endurance. R LE is grossly weaker than L with R>L LE flexibility. Provided pt new exercises to work on increased load through LEs. Have not yet received referral to address lumbar pain (pt states this referral should be coming in). Pt will benefit from PT to address these issues for improved body mechanics to decrease overall pain.   OBJECTIVE IMPAIRMENTS: decreased activity tolerance, decreased balance, decreased endurance, decreased  mobility, difficulty walking, decreased strength, impaired sensation, improper body mechanics, postural dysfunction, and pain.   ACTIVITY LIMITATIONS: lifting, bending, standing, squatting, locomotion level, and caring for others  PARTICIPATION LIMITATIONS: meal  prep, cleaning, community activity, and occupation  PERSONAL FACTORS: Fitness, Past/current experiences, Profession, and Time since onset of injury/illness/exacerbation are also affecting patient's functional outcome.   REHAB POTENTIAL: Good  CLINICAL DECISION MAKING: Evolving/moderate complexity  EVALUATION COMPLEXITY: Moderate   GOALS: Goals reviewed with patient? Yes  SHORT TERM GOALS: Target date: 10/13/2022  Pt will be ind with initial HEP Baseline: Goal status: INITIAL  2.  Pt will be able to perform 10 single leg sit<>stand to demo improving LE stability and strength Baseline:  Goal status: INITIAL    LONG TERM GOALS: Target date: 11/10/2022   Pt will be ind with management and progression of HEP Baseline:  Goal status: INITIAL  2.  Pt will report >/=50% decrease in knee hyperextension during gait Baseline:  Goal status: INITIAL  3.  Pt will be able to demo safe lifting techniques for at least 25#  Baseline:  Goal status: INITIAL  4.  Pt will have improved FOTO score to >/=66 Baseline:  Goal status: INITIAL    PLAN:  PT FREQUENCY: 1x/week  PT DURATION: 8 weeks  PLANNED INTERVENTIONS: Therapeutic exercises, Therapeutic activity, Neuromuscular re-education, Balance training, Gait training, Patient/Family education, Self Care, Joint mobilization, Stair training, Aquatic Therapy, Dry Needling, Electrical stimulation, Cryotherapy, Moist heat, Taping, Vasopneumatic device, Ionotophoresis 4mg /ml Dexamethasone, Manual therapy, and Re-evaluation  PLAN FOR NEXT SESSION: Assess response to HEP. Continue to progress quad strengthening (isometrics and eccentrics). Gross R LE strengthening and stabilization  without knee hyperextension.    Kama Cammarano April Ma L Carlyne Keehan, PT 09/15/2022, 4:15 PM

## 2022-09-15 NOTE — Progress Notes (Signed)
NEW PATIENT Date of Service/Encounter:  09/17/22 Referring provider: Collene Mares, PA Primary care provider: Collene Mares, [place]  Subjective:  David Moon is a 32 y.o. adult with a PMHx of adrenal malignancy, pneumothorax, history of pancreatitis, gender dysphoria presenting today for evaluation of chronic abdominal issues. History obtained from: chart review and patient and partner.  Concern for Food Allergy:  History of reaction: everything he eat goes right through him. He was evaluated by a GI specialist who told him that there were no issues with his stomach and his GI tract was fine and therefore must have a food allergy.  Last seen by GI end of last year after endoscopy and colonoscopy, reportedly normal. Associates extreme pain in the stomach with diarrhea. Triggers: dairy, seeds Tries to only use lactose free dairy. Helps some, but does not eliminate symptoms. Previous allergy testing no Carries an epinephrine autoinjector: yes  Also gets random rashes.  Occurs once per week. Rash is itchy. Lasts a few hours. Partner thinks it is from stress.  Will take benadryl as needed which doesn't seem to help much. Started two years ago. Has been on hormones since 2016.   When eating fish, swells up significantly.  Accidentally ate it 3 years ago and developed full body hives after touching fish only. Has an Epipen. Eats shellfish with out symptoms.  Has many animals in the home.  Rats seem to make him have some swelling and itching. Cats and dogs don't bother him. Has 37 reptiles in the home. Do not bother him. Has not been tested for salmonella.   Of note, is followed by pulmonary for lung nodules, SOB on exertion and hx of pneumothorax so we have not addressed their respiratory complaints today. Started on breo elipta with plan for fu in 1 year. LV 08/18/22  Past Medical History: 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HCC)    Ovarian  cyst    Medication List:  Current Outpatient Medications  Medication Sig Dispense Refill   buPROPion (WELLBUTRIN SR) 150 MG 12 hr tablet Take 150 mg by mouth 2 (two) times daily.     fluticasone furoate-vilanterol (BREO ELLIPTA) 200-25 MCG/ACT AEPB Inhale 1 puff into the lungs daily. 60 each 3   gabapentin (NEURONTIN) 300 MG capsule Take 300 mg by mouth 2 (two) times daily.     testosterone cypionate (DEPOTESTOSTERONE CYPIONATE) 200 MG/ML injection Inject 200 mg into the muscle every 14 (fourteen) days.     No current facility-administered medications for this visit.   Known Allergies:  Not on File Past Surgical History: Past Surgical History:  Procedure Laterality Date   ADRENALECTOMY     TONSILECTOMY, ADENOIDECTOMY, BILATERAL MYRINGOTOMY AND TUBES     Family History: Family History  Problem Relation Age of Onset   Hypertension Mother    Atrial fibrillation Mother    Asthma Mother    Asthma Sister    Lung cancer Maternal Grandmother    Lung cancer Paternal Grandmother    Social History: Alter lives in a mobile home with water damage, laminate floors, central AC, cats dogs reptiles indoors, no roaches, using dust mite protection on the beds, smoking since April 2024.  No HEPA filter in the home, [place].  Exposed to smoke in the home in the car.  Manages people x 4 years.   ROS:  All other systems negative except as noted per HPI.  Objective:  Blood pressure 130/76,  pulse 76, temperature 98.2 F (36.8 C), temperature source Temporal, resp. rate 16, height 5' 6.75" (1.695 m), weight 236 lb (107 kg), SpO2 97 %. Body mass index is 37.24 kg/m. Physical Exam:  General Appearance:  Alert, cooperative, no distress, appears stated age  Head:  Normocephalic, without obvious abnormality, atraumatic  Eyes:  Conjunctiva clear, EOM's intact  Nose: Nares normal, hypertrophic turbinates and normal mucosa  Throat: Lips, tongue normal; teeth and gums  normal, normal posterior oropharynx  Neck: Supple, symmetrical  Lungs:   clear to auscultation bilaterally, Respirations unlabored, no coughing  Heart:  regular rate and rhythm and no murmur, Appears well perfused  Extremities: No edema  Skin: Skin color, texture, turgor normal and no rashes or lesions on visualized portions of skin  Neurologic: No gross deficits   Diagnostics: Spirometry:  Tracings reviewed. His effort: It was hard to get consistent efforts and there is a question as to whether this reflects a maximal maneuver. FVC: 3.15L  FEV1: 2.63L, 66% predicted FEV1/FVC ratio: 0.83 Interpretation: Nonobstructive ratio, low FEV1, possible restriction.  Please see scanned spirometry results for details.  Skin Testing: Food allergy panel. Adequate positive and negative controls. Results discussed with patient/family.  Airborne Adult Perc - 09/17/22 1407     Time Antigen Placed 1410    Allergen Manufacturer Waynette Buttery    Location Back    Number of Test 55    1. Control-Buffer 50% Glycerol Omitted    2. Control-Histamine Omitted    3. [place]ia Omitted    4. [place] Omitted    5. Johnson Omitted    6. [place] Blue Omitted    7. Meadow Fescue Omitted    8. Perennial Rye Omitted    9. Timothy Omitted    10. Ragweed Mix Omitted    11. Cocklebur Omitted    12. Plantain,  English Omitted    13. Baccharis Omitted    14. Dog Fennel Omitted    15. [place] Thistle Omitted    16. Lamb's Quarters Omitted    17. Sheep Sorrell Omitted    18. Rough Pigweed Omitted    19. Marsh Elder, Rough Omitted    20. Mugwort, Common Omitted    21. Box, Elder Omitted    22. Cedar, red Omitted    23. Sweet Gum Omitted    24. Pecan Pollen Omitted    25. [address] Mix Omitted    26. Walnut, Black Pollen Omitted    27. Red Mulberry Omitted    28. Ash Mix Omitted    29. Birch Mix Omitted    30. [employer]    31. Cottonwood, [place] Omitted    32. Hickory, White Omitted    33. Maple Mix  Omitted    34. Oak, [place] Mix Omitted    35. Sycamore [place] Omitted    36. Alternaria Alternata Omitted    37. Cladosporium Herbarum Omitted    38. Aspergillus Mix Omitted    39. Penicillium Mix Omitted    40. Bipolaris Sorokiniana (Helminthosporium) Omitted    41. Drechslera Spicifera (Curvularia) Omitted    42. Mucor Plumbeus Omitted    43. Fusarium Moniliforme Omitted    44. Aureobasidium Pullulans (pullulara) Omitted    45. Rhizopus Oryzae Omitted    46. Botrytis Cinera Omitted    47. Epicoccum Nigrum Omitted    48. Phoma Betae Omitted    49. Dust Mite Mix Omitted    50. Cat Hair 10,000 BAU/ml Omitted    51.  Dog  Epithelia Omitted    52. Mixed Feathers Omitted    53. Horse Epithelia Omitted    54. Cockroach, [place] Omitted    55. Tobacco Leaf Omitted             Food Adult Perc - 09/17/22 1400     Time Antigen Placed 1410    Allergen Manufacturer Waynette Buttery    Location Back    Number of allergen test 72     Control-buffer 50% Glycerol Negative    Control-Histamine 3+    1. Peanut Negative    2. Soybean Negative    3. Wheat Negative    4. Sesame Negative    5. Milk, Cow Negative    6. Casein Negative    7. Egg White, Chicken Negative    8. Shellfish Mix Negative    9. Fish Mix Negative    10. Cashew Negative    11. Walnut Food Negative    12. Almond Negative    13. Hazelnut Negative    14. Pecan Food Negative    15. Pistachio Negative    16. [place] Nut Negative    17. Coconut Negative    18. Trout Negative    19. Tuna Negative    20. Salmon Negative    21. Flounder Negative    22. Codfish Negative    23. Shrimp Negative    24. Crab Negative    25. Lobster Negative    26. Oyster Negative    27. Scallops Negative    28. Oat  Negative    29. Rice Negative    30. Barley Negative    31. Rye  Negative    32. Hops Negative    33. [place] Meat Negative    34. Chicken Meat Negative    35. Pork Negative    36. Beef Negative    37. Lamb Negative    38.  Tomato Negative    39. White Potato Negative    39. Sweet Potato Negative    41. Pea, Green/English Negative    42. Navy Bean Negative    43. Green Beans Negative    44. Squash Negative    45. Green Pepper Negative    46. Mushrooms Negative    47. Onion Negative    48. Avocado Negative    49. Cabbage Negative    50. Carrots Negative    51. Celery Negative    52. Corn Negative    53. Cucumber Negative    54. Grape (White seedless) Negative    55. Orange  Negative    56. Lemon Negative    57. Banana Negative    58. Apple Negative    59. Peach Negative    60. Strawberry Negative    61. Blueberry Negative    62. Cherry Negative    63. Cantaloupe Negative    64. Watermelon Negative    65. Pineapple Negative    66. Chocolate/Cacao Bean Negative    67. Cinnamon Negative    68. Nutmeg Negative    69. Ginger Negative    70. Garlic Negative    71. Pepper, Black Negative    72. Mustard Negative            Allergy testing results were read by Larena Sox, documented by clinical staff  Assessment and Plan  On further review of patient's history, symptoms are most consistent with food intolerance vs other GI issues and not a true type I hypersensitivity reaction/food allergy with the  exception of possible fish allergy based on history.  Discussed considering following up with GI, nutrition. Trying a whole food diet, avoiding processed, fried and sugary foods.  Food allergy:  rash and swelling after fish-concerning for true food allergy - today's skin testing was negative to entire food allergies - labs today - fish panel and food panel - please strictly avoid fish for now - okay to continue eating shellfish - for SKIN only reaction, okay to take Benadryl 2 capsules every 4 hours - for SKIN + ANY additional symptoms, OR IF concern for LIFE THREATENING reaction = Epipen Autoinjector EpiPen 0.3 mg. - If using Epinephrine autoinjector, call 911 - A food allergy action  plan has been provided and discussed. - Medic Alert identification is recommended.  Chronic diarrhea: Suspect other GI illness (IBS vs other) vs food intolerance   Food Intolerance Plan - start an elimination diet by identifying any particular food or food group that may worsen your symptoms, eliminate these foods, and slowly try reintroducing after at least a 2-4 week period, if symptoms return, this is a food you should avoid - the symptoms you are having are not consistent with a life-threatening food allergy, and there is no reliable allergy testing at this time that can identify food intolerances - the testing done today should be used as a guide only for food elimination diets - consider GI and nutrition referral for help with underlying symptoms and diet  Chronic Idiopathic Urticaria: - this is defined as hives lasting more than 6 weeks without an identifiable trigger - hives can be from a number of different sources including infections, allergies, vibration, temperature, pressure among many others other possible causes - often an identifiable cause is not determined - some potential triggers include: stress, illness, NSAIDs, aspirin, hormonal changes - you do not have any red flag symptoms to make [place] concerned about secondary causes of hives - approximately 50% of patients with chronic hives can have some associated swelling of the face/lips/eyelids (this is not a cause for alarm and does not typically progress onto systemic allergic reactions)  Therapy Plan:  - start zyrtec (cetirizine) 10mg  once daily - if hives are uncontrolled, increase zyrtec (cetirizine) to 10mg  twice daily - if hives remain uncontrolled, increase dose of zyrtec (cetirizine) to max dose of 20mg  (2 pills) twice daily- this is maximum dose - can increase or decrease dosing depending on symptom control to a maximum dose of 4 tablets of antihistamine daily. Wait until hives free for at least one month prior to  decreasing dose.   - if hives are still uncontrolled with the above regimen, please arrange an appointment for discussion of Xolair (omalizumab)- an injectable medication for hives  Can use one of the following in place of zyrtec if desires: Claritin (loratadine) 10 mg, Xyzal (levocetirizine) 5 mg or Allegra (fexofenadine) 180 mg daily as needed  Shortness of breath:  - continue follow-up with pulmonary  Follow up : 6 months, sooner if needed It was a pleasure meeting you in clinic today! Thank you for allowing me to participate in your care.  This note in its entirety was forwarded to the Provider who requested this consultation.  Thank you for your kind referral. I appreciate the opportunity to take part in Jhoel's care. Please do not hesitate to contact me with questions.  Sincerely,  Tonny Bollman, MD [facility]

## 2022-09-17 ENCOUNTER — Ambulatory Visit: Payer: 59 | Admitting: Internal Medicine

## 2022-09-17 ENCOUNTER — Other Ambulatory Visit: Payer: Self-pay

## 2022-09-17 ENCOUNTER — Encounter: Payer: Self-pay | Admitting: Internal Medicine

## 2022-09-17 VITALS — BP 130/76 | HR 76 | Temp 98.2°F | Resp 16 | Ht 66.75 in | Wt 236.0 lb

## 2022-09-17 DIAGNOSIS — T781XXA Other adverse food reactions, not elsewhere classified, initial encounter: Secondary | ICD-10-CM

## 2022-09-17 DIAGNOSIS — L508 Other urticaria: Secondary | ICD-10-CM

## 2022-09-17 DIAGNOSIS — R0602 Shortness of breath: Secondary | ICD-10-CM

## 2022-09-17 DIAGNOSIS — K529 Noninfective gastroenteritis and colitis, unspecified: Secondary | ICD-10-CM | POA: Diagnosis not present

## 2022-09-17 NOTE — Patient Instructions (Signed)
Food allergy:  rash and swelling after fish-concerning for true food allergy - today's skin testing was negative to entire food allergies - labs today - fish panel and food panel - please strictly avoid fish for now - okay to continue eating shellfish - for SKIN only reaction, okay to take Benadryl 2 capsules every 4 hours - for SKIN + ANY additional symptoms, OR IF concern for LIFE THREATENING reaction = Epipen Autoinjector EpiPen 0.3 mg. - If using Epinephrine autoinjector, call 911 - A food allergy action plan has been provided and discussed. - Medic Alert identification is recommended.  Chronic diarrhea: Suspect other GI illness (IBS vs other) vs food intolerance   Food Intolerance Plan - start an elimination diet by identifying any particular food or food group that may worsen your symptoms, eliminate these foods, and slowly try reintroducing after at least a 2-4 week period, if symptoms return, this is a food you should avoid - the symptoms you are having are not consistent with a life-threatening food allergy, and there is no reliable allergy testing at this time that can identify food intolerances - the testing done today should be used as a guide only for food elimination diets - consider GI and nutrition referral for help with underlying symptoms and diet  Chronic Idiopathic Urticaria: - this is defined as hives lasting more than 6 weeks without an identifiable trigger - hives can be from a number of different sources including infections, allergies, vibration, temperature, pressure among many others other possible causes - often an identifiable cause is not determined - some potential triggers include: stress, illness, NSAIDs, aspirin, hormonal changes - you do not have any red flag symptoms to make [place] concerned about secondary causes of hives - approximately 50% of patients with chronic hives can have some associated swelling of the face/lips/eyelids (this is not a cause for  alarm and does not typically progress onto systemic allergic reactions)  Therapy Plan:  - start zyrtec (cetirizine) 10mg  once daily - if hives are uncontrolled, increase zyrtec (cetirizine) to 10mg  twice daily - if hives remain uncontrolled, increase dose of zyrtec (cetirizine) to max dose of 20mg  (2 pills) twice daily- this is maximum dose - can increase or decrease dosing depending on symptom control to a maximum dose of 4 tablets of antihistamine daily. Wait until hives free for at least one month prior to decreasing dose.   - if hives are still uncontrolled with the above regimen, please arrange an appointment for discussion of Xolair (omalizumab)- an injectable medication for hives  Can use one of the following in place of zyrtec if desires: Claritin (loratadine) 10 mg, Xyzal (levocetirizine) 5 mg or Allegra (fexofenadine) 180 mg daily as needed   Follow up : 6 months, sooner if needed It was a pleasure meeting you in clinic today! Thank you for allowing me to participate in your care.  Tonny Bollman, MD [facility]

## 2022-09-20 LAB — ALLERGENS(14)
Allergen Corn, IgE: <0.1 kU/L
Allergen Salmon IgE: <0.1 kU/L
Beef IgE: <0.1 kU/L
Chocolate/Cacao IgE: <0.1 kU/L
Codfish IgE: <0.1 kU/L
Egg, Whole IgE: <0.1 kU/L
F037-IgE Mussel: <0.1 kU/L
Milk IgE: <0.1 kU/L
Peanut IgE: <0.1 kU/L
Pork IgE: <0.1 kU/L
Shrimp IgE: <0.1 kU/L
Soybean IgE: <0.1 kU/L
Tuna: <0.1 kU/L
Wheat IgE: <0.1 kU/L

## 2022-09-20 LAB — ALLERGEN PROFILE, FOOD-FISH
Allergen Mackerel IgE: <0.1 kU/L
Allergen Trout IgE: <0.1 kU/L
Allergen Walley Pike IgE: <0.1 kU/L
Halibut IgE: <0.1 kU/L

## 2022-09-20 NOTE — Progress Notes (Signed)
Please let [place] know that both fish panel and basic food panels returned completely negative.  If she would like to eat fish again, would recommend an in office oral challenge.  For other foods, this again rules out food allergy, but does not rule out intolerance.  Let me know if he has any questions.

## 2022-09-22 ENCOUNTER — Ambulatory Visit: Payer: 59

## 2022-09-27 ENCOUNTER — Ambulatory Visit: Payer: 59

## 2022-10-12 ENCOUNTER — Ambulatory Visit: Payer: 59 | Admitting: Physical Therapy

## 2023-01-24 ENCOUNTER — Ambulatory Visit: Payer: 59 | Admitting: Orthopedic Surgery

## 2023-01-24 ENCOUNTER — Other Ambulatory Visit (INDEPENDENT_AMBULATORY_CARE_PROVIDER_SITE_OTHER): Payer: 59

## 2023-01-24 ENCOUNTER — Encounter: Payer: Self-pay | Admitting: Orthopedic Surgery

## 2023-01-24 VITALS — BP 127/83 | HR 76 | Ht 66.75 in | Wt 236.0 lb

## 2023-01-24 DIAGNOSIS — M546 Pain in thoracic spine: Secondary | ICD-10-CM

## 2023-01-24 NOTE — Progress Notes (Signed)
Orthopedic Spine Surgery Office Note 2  Assessment: Patient is a 32 y.o. adult with chronic thoracolumbar and lumbar back pain.  Has pain that radiates into the right lower extremity along the posterior aspect but has no stenosis on the lumbar MRI.  Possible piriformis syndrome Also has bilateral hand pain numbness and paresthesias with positive carpal tunnel physical exam findings   Plan: -Recommended EMG/NCS of the bilateral upper extremities to workup carpal tunnel syndrome further.  Also recommended, the same study for the right lower extremity -Patient was prescribed and given bilateral night splints for carpal tunnel syndrome -Patient has tried physical therapy, Tylenol, NSAIDs -For his chronic low back pain, recommended core strengthening.  Told him to do exercises 3-4 times per week.  I also told him that his job where he is lifting 75 pounds routinely may aggravate the back pain as well -Would need to be nicotine free prior to any elective spine surgery -Patient should return to office in 5 weeks, x-rays at next visit: None   Patient expressed understanding of the plan and all questions were answered to the patient's satisfaction.   ___________________________________________________________________________   History:  Patient is a 32 y.o. adult who presents today for his cervical and lumbar spine.  Patient has had a long history of lower thoracic and upper lumbar back pain.  There is no specific trauma or injury that preceded the onset of pain.  However, he stated that he routinely is lifting 75 pound boxes at his job and that will often aggravate the pain.  He states that he does have pain that radiates into the right lower extremity.  He feels it goes in the center of his hip and then along the posterior aspect of the thigh and leg to the level of the foot.  He notes the radiating leg pain particularly when going from a seated to standing position.  The pain is not felt once he has  been in a position for a while.  He does not have any other pain rating into his lower extremities.  In regards to his cervical spine, he was seen by an outside provider and, per the patient, he was told that he had multiple bone spurs in his neck.  His complaints are bilateral numbness paresthesias and pain in his hands.  He states that his hands will sometimes go numb and he has difficulty using them as a result of the decrease in sensation.  He has had weakness in his hands and drops objects as well.  He does not have any pain in the neck or pain radiating into the upper extremities.  His pain is localized at the wrist and distal to it.   Weakness: Yes, hands feel weaker and has been dropping objects.  No other weakness noted Difficulty with fine motor skills (e.g., buttoning shirts, handwriting): Yes, has had issues dropping objects and with fine motor skills in his hands as a result of the decrease in sensation in his hands Symptoms of imbalance: Denies Bowel or bladder incontinence: Has had intermittent issues with incontinence.  States he will have an episode once a month but no regular incontinence and no recent changes in bowel or bladder habits Saddle anesthesia: Denies  Treatments tried: PT, Tylenol, NSAIDs  Review of systems: Denies fevers and chills, night sweats, unexplained weight loss.  Has a history of adrenal cancer.  Reports pain that wakes him at night.  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 Pilonidal cyst removal Tonsillectomy and adenoidectomy  Social history: Rare use of nicotine product (smoking, vaping, patches, smokeless) Alcohol use: Denies Denies recreational drug use   Physical Exam:  BMI of 37.2  General: no acute distress, appears stated age Neurologic: alert, answering questions  appropriately, following commands Respiratory: unlabored breathing on room air, symmetric chest rise Psychiatric: appropriate affect, normal cadence to speech   MSK (spine):  -Strength exam      Left  Right Grip strength                5/5  5/5 Interosseus   5/5   5/5 Wrist extension  5/5  5/5 Wrist flexion   5/5  5/5 Elbow flexion   5/5  5/5 Deltoid    5/5  5/5  EHL    5/5  5/5 TA    5/5  5/5 GSC    5/5  5/5 Knee extension  5/5  5/5 Hip flexion   5/5  5/5  -Sensory exam    Sensation intact to light touch in L3-S1 nerve distributions of bilateral lower extremities  Sensation intact to light touch in C5-T1 nerve distributions of bilateral upper extremities  -Brachioradialis DTR: 2/4 on the left, 2/4 on the right -Biceps DTR: 2/4 on the left, 2/4 on the right -Achilles DTR: 2/4 on the left, 2/4 on the right -Patellar tendon DTR: 2/4 on the left, 2/4 on the right  -Spurling: Negative bilaterally -Hoffman sign: Negative bilaterally -Clonus: No beats bilaterally -Interosseous wasting: None seen -Grip and release test: Negative -Gait: Normal  Left shoulder exam: No pain through range of motion Right shoulder exam: No pain through range of motion Left hip exam: No pain through range of motion Right hip exam: No pain through range of motion  Tinel's at wrist: negative bilaterally  Phalen's at wrist: positive bilaterally Durkan's: positive bilaterally  Tinel's at elbow: negative bilaterally  Imaging: XRs of the cervical spine (on disc) from 09/21/2022 were independently reviewed and interpreted, showing no significant degenerative changes.  No evidence of instability on flexion/extension views.  No fracture or dislocation seen.  XRs of the thoracic spine from 01/24/2023 was independently reviewed and interpreted, showing kyphotic alignment.  Upper thoracic spine not well-visualized due to overlying ribs and humeral head.  No fracture or dislocation seen.  No significant  degenerative changes.  XRs of the lumbar spine (on disc) from 08/26/2022 were independently reviewed and interpreted, showing disc height loss at L4/5 and L5/S1.  No other significant degenerative changes seen.  No evidence of instability on flexion/extension views.  No fracture or dislocation seen.  MRI of the lumbar spine (on disc) from 10/10/2022 was independently reviewed and interpreted, showing disc desiccation at L4/5 and L5/S1. No significant central, lateral recess, or foraminal stenosis.    Patient name: David Moon Patient MRN: [id number] Date of visit: 01/24/23

## 2023-01-28 ENCOUNTER — Other Ambulatory Visit: Payer: Self-pay

## 2023-01-28 ENCOUNTER — Encounter: Payer: Self-pay | Admitting: Neurology

## 2023-01-28 DIAGNOSIS — R202 Paresthesia of skin: Secondary | ICD-10-CM

## 2023-02-28 ENCOUNTER — Ambulatory Visit: Payer: 59 | Admitting: Orthopedic Surgery

## 2023-03-01 ENCOUNTER — Ambulatory Visit: Payer: 59 | Admitting: Neurology

## 2023-03-01 DIAGNOSIS — R202 Paresthesia of skin: Secondary | ICD-10-CM | POA: Diagnosis not present

## 2023-03-01 NOTE — Procedures (Signed)
  [facility]  [address] [place], [address]  [place], [place] [address] Tel: [phone] Fax: [phone] Test Date:  03/01/2023  Patient: David Moon DOB: March 26, 1991 Physician: Jacquelyne Balint, MD  Sex: --- Height: 5' 6.75" Ref Phys: Willia Craze, MD  ID#: [id number]   Technician:    History: This is a 32 year old adult with pain radiating into the right lower limb.  NCV & EMG Findings: Extensive electrodiagnostic evaluation of the right lower limb shows: Right sural and superficial peroneal/fibular sensory responses are within normal limits. Right peroneal/fibular (EDB) and tibial (AH) motor responses are within normal limits. Right H reflex latency is within normal limits. There is no evidence of active or chronic motor axon loss changes affecting any of the tested muscles. Motor unit configuration and recruitment pattern is within normal limits.  Impression: This is a normal study of the right lower limb. In particular, there is no electrodiagnostic evidence of a right lumbosacral (L3-S1) radiculopathy, large fiber sensorimotor neuropathy, or right sciatic, tibial, or peroneal mononeuropathy.    ___________________________ Jacquelyne Balint, MD    Nerve Conduction Studies Motor Nerve Results    Latency Amplitude F-Lat Segment Distance CV Comment  Site (ms) Norm (mV) Norm (ms)  (cm) (m/s) Norm   Right Fibular (EDB) Motor  Ankle 3.3  < 5.5 10.6  > 3.0        Bel fib head 9.7 - 9.7 -  Bel fib head-Ankle 31 48  > 40   Pop fossa 11.6 - 9.5 -  Pop fossa-Bel fib head 9 47 -   Right Tibial (AH) Motor  Ankle 3.9  < 6.0 13.8  > 8.0        Knee 11.4 - 11.6 -  Knee-Ankle 40 53  > 40    Sensory Sites    Neg Peak Lat Amplitude (O-P) Segment Distance Velocity Comment  Site (ms) Norm (V) Norm  (cm) (ms)   Right Superficial Fibular Sensory  14 cm-Ankle 2.6  < 4.5 10  > 5 14 cm-Ankle 14    Right Sural Sensory  Calf-Lat mall 3.9  < 4.5 15  > 5 Calf-Lat mall 14     H-Reflex  Results    M-Lat H Lat H Neg Amp H-M Lat  Site (ms) (ms) Norm (mV) (ms)  Right Tibial H-Reflex  Pop fossa 6.9 32.5  < 35.0 0.93 25.6   Electromyography   Side Muscle Ins.Act Fibs Fasc Recrt Amp Dur Poly Activation Comment  Right Tib ant Nml Nml Nml Nml Nml Nml Nml Nml N/A  Right Gastroc MH Nml Nml Nml Nml Nml Nml Nml Nml N/A  Right FDL Nml Nml Nml Nml Nml Nml Nml Nml N/A  Right Rectus fem Nml Nml Nml Nml Nml Nml Nml Nml N/A  Right Biceps fem SH Nml Nml Nml Nml Nml Nml Nml Nml N/A  Right Gluteus med Nml Nml Nml Nml Nml Nml Nml Nml N/A      Waveforms:  Motor      Sensory      H-Reflex

## 2023-03-07 ENCOUNTER — Ambulatory Visit: Payer: 59 | Admitting: Neurology

## 2023-03-07 DIAGNOSIS — R202 Paresthesia of skin: Secondary | ICD-10-CM

## 2023-03-07 DIAGNOSIS — G5602 Carpal tunnel syndrome, left upper limb: Secondary | ICD-10-CM

## 2023-03-07 NOTE — Procedures (Signed)
[facility]  [address] [place], [address]  [place], [place] [address] Tel: [phone] Fax: [phone] Test Date:  03/07/2023  Patient: David Moon DOB: 03/09/1991 Physician: Jacquelyne Balint, MD  Sex: --- Height: 5' 6.75" Ref Phys: Willia Craze, MD  ID#: [id number]   Technician:    History: This is a 32 year old adult with bilateral hand numbness and tingling.  NCV & EMG Findings: Extensive electrodiagnostic evaluation of bilateral upper limbs shows: Left median-ulnar palmar sensory response shows abnormal peak latency difference (0.47 ms). Right median-ulnar palmar, bilateral median, bilateral ulnar, and bilateral radial sensory responses are within normal limits. Bilateral median (APB) and ulnar (ADM) motor responses are within normal limits. There is no evidence of active or chronic motor axon loss changes affecting any of the tested muscles on needle examination. Motor unit configuration and recruitment pattern is within normal limits.  Impression: This is an abnormal study. The findings are most consistent with the following: Evidence of a left median mononeuropathy at or distal to the wrist, consistent with carpal tunnel syndrome, very mild in degree electrically. No electrodiagnostic evidence of a right median mononeuropathy at or distal to the wrist, consistent with carpal tunnel syndrome. No electrodiagnostic evidence of a right or left cervical (C5-C8) motor radiculopathy. Screening studies for right or left ulnar or radial mononeuropathies are normal.    ___________________________ Jacquelyne Balint, MD    Nerve Conduction Studies Motor Nerve Results    Latency Amplitude F-Lat Segment Distance CV Comment  Site (ms) Norm (mV) Norm (ms)  (cm) (m/s) Norm   Left Median (APB) Motor  Wrist 2.8  < 3.9 6.4  > 6.0        Elbow 7.5 - 5.8 -  Elbow-Wrist 27 57  > 50   Right Median (APB) Motor  Wrist 3.0  < 3.9 6.4  > 6.0        Elbow 7.4 - 6.2 -  Elbow-Wrist 25.5 58   > 50   Left Ulnar (ADM) Motor  Wrist 1.78  < 3.1 10.8  > 7.0        Bel elbow 5.1 - 10.5 -  Bel elbow-Wrist 22 67  > 50   Ab elbow 6.7 - 10.1 -  Ab elbow-Bel elbow 10 63 -   Right Ulnar (ADM) Motor  Wrist 1.70  < 3.1 11.5  > 7.0        Bel elbow 5.1 - 10.4 -  Bel elbow-Wrist 22 65  > 50   Ab elbow 6.8 - 10.4 -  Ab elbow-Bel elbow 10 59 -    Sensory Sites    Neg Peak Lat Amplitude (O-P) Segment Distance Velocity Comment  Site (ms) Norm (V) Norm  (cm) (ms)   Left Median Sensory  Wrist-Dig II 3.1  < 3.4 32  > 20 Wrist-Dig II 13    Right Median Sensory  Wrist-Dig II 3.1  < 3.4 30  > 20 Wrist-Dig II 13    Left Median-Ulnar Palmar Sensory       Median  Palm-Wrist 2.1  < 2.2 47  > 10 Palm-Wrist 8         Ulnar  Palm-Wrist 1.63  < 2.2 34  > 5 Palm-Wrist 8    Right Median-Ulnar Palmar Sensory       Median  Palm-Wrist 2.0  < 2.2 56  > 10 Palm-Wrist 8         Ulnar  Palm-Wrist 1.70  < 2.2 18  > 5  Palm-Wrist 8    Left Radial Sensory  Forearm-Wrist 1.88  < 2.7 21  > 18 Forearm-Wrist 10    Right Radial Sensory  Forearm-Wrist 2.5  < 2.7 20  > 18 Forearm-Wrist 10    Left Ulnar Sensory  Wrist-Dig V 2.4  < 3.1 27  > 12 Wrist-Dig V 11    Right Ulnar Sensory  Wrist-Dig V 2.5  < 3.1 16  > 12 Wrist-Dig V 11     Inter-Nerve Comparisons   Nerve 1 Value 1 Nerve 2 Value 2 Parameter Result Normal  Sensory Sites  R Median Palm-Wrist 2.0 ms R Ulnar Palm-Wrist 1.70 ms Peak Lat Diff 0.30 ms <0.40  L Median Palm-Wrist 2.1 ms L Ulnar Palm-Wrist 1.63 ms Peak Lat Diff *0.47 ms <0.40   Electromyography   Side Muscle Ins.Act Fibs Fasc Recrt Amp Dur Poly Activation Comment  Left FDI Nml Nml Nml Nml Nml Nml Nml Nml N/A  Left EIP Nml Nml Nml Nml Nml Nml Nml Nml N/A  Left Pronator teres Nml Nml Nml Nml Nml Nml Nml Nml N/A  Left Biceps Nml Nml Nml Nml Nml Nml Nml Nml N/A  Left Triceps Nml Nml Nml Nml Nml Nml Nml Nml N/A  Left Deltoid Nml Nml Nml Nml Nml Nml Nml Nml N/A  Right FDI Nml Nml Nml Nml Nml Nml  Nml Nml N/A  Right EIP Nml Nml Nml Nml Nml Nml Nml Nml N/A  Right FPL Nml Nml Nml Nml Nml Nml Nml Nml N/A  Right Pronator teres Nml Nml Nml Nml Nml Nml Nml Nml N/A  Right Biceps Nml Nml Nml Nml Nml Nml Nml Nml N/A  Right Triceps Nml Nml Nml Nml Nml Nml Nml Nml N/A  Right Deltoid Nml Nml Nml Nml Nml Nml Nml Nml N/A      Waveforms:  Motor           Sensory

## 2023-03-14 ENCOUNTER — Ambulatory Visit: Payer: 59 | Admitting: Orthopedic Surgery

## 2023-03-14 DIAGNOSIS — G8929 Other chronic pain: Secondary | ICD-10-CM

## 2023-03-14 DIAGNOSIS — M5441 Lumbago with sciatica, right side: Secondary | ICD-10-CM | POA: Diagnosis not present

## 2023-03-14 NOTE — Progress Notes (Signed)
Orthopedic Spine Surgery Office Note 2   Assessment: Patient is a 32 y.o. adult with chronic lumbar back pain.  Has pain that radiates into the right lower extremity along the posterior aspect but has no stenosis on the lumbar MRI and no evidence of radiculopathy on EMG/NCS Also has bilateral hand pain numbness and paresthesias with positive carpal tunnel physical exam findings. EMG/NCS shows evidence of left carpal tunnel syndrome     Plan: -Patient has tried physical therapy, Tylenol, NSAIDs, muscle relaxers -I went over several non-operative treatments for his back pain, but he had tried them already. I mentioned pain management as an option. He was interested in that so a referral was provided to [facility] pain management -I told him I do not have an explanation for his right leg pain as his MRI does not show any stenosis and his EMG/NCS did not show any evidence of radiculopathy or mononeuropathy  -Would need to be nicotine free prior to any elective spine surgery -Talked about referring him to hand surgery for is carpal tunnel but he was not interested in that at this time -Patient should return to office on an as needed basis     Patient expressed understanding of the plan and all questions were answered to the patient's satisfaction.    ___________________________________________________________________________     History:   Patient is a 32 y.o. adult who presents today for follow up on his hand numbness/paresthesias and his low back pain that radiates into his right lower extremity. Patient still with significant upper lumbar back pain. He also has pain in his right lower extremity which he feels going along the posterior aspect of his thigh and leg. Not having any left leg symptoms. The back pain is worse than the leg pain.    He has not developed any new upper extremity symptoms. He still has bilateral numbness and paresthesias in the hands. He also gets pain in the hands. This  is not as severe as his low back pain. He sometimes has weakness in his hands when he symptoms are bad. Does not have any pain in his neck or radiating into his upper extremities.      Treatments tried: PT, Tylenol, NSAIDs, muscle relaxer    Physical Exam:   General: no acute distress, appears stated age Neurologic: alert, answering questions appropriately, following commands Respiratory: unlabored breathing on room air, symmetric chest rise Psychiatric: appropriate affect, normal cadence to speech     MSK (spine):   -Strength exam                                                   Left                  Right EHL                              5/5                  5/5 TA                                 5/5                  5/5  GSC                             5/5                  5/5 Knee extension            5/5                  5/5 Hip flexion                    5/5                  5/5   -Sensory exam                           Sensation intact to light touch in L3-S1 nerve distributions of bilateral lower extremities    Imaging: XRs of the cervical spine (on disc) from 09/21/2022 were previously independently reviewed and interpreted, showing no significant degenerative changes.  No evidence of instability on flexion/extension views.  No fracture or dislocation seen.   XRs of the thoracic spine from 01/24/2023 were previously independently reviewed and interpreted, showing kyphotic alignment.  Upper thoracic spine not well-visualized due to overlying ribs and humeral head.  No fracture or dislocation seen.  No significant degenerative changes.   XRs of the lumbar spine (on disc) from 08/26/2022 were previously independently reviewed and interpreted, showing disc height loss at L4/5 and L5/S1.  No other significant degenerative changes seen.  No evidence of instability on flexion/extension views.  No fracture or dislocation seen.   MRI of the lumbar spine (on disc) from 10/10/2022 was  previously independently reviewed and interpreted, showing disc desiccation at L4/5 and L5/S1. No significant central, lateral recess, or foraminal stenosis.    EMG/NCS of the right leg from 03/01/2023 that was ordered by myself was reviewed and showed no evidence of radiculopathy. There was also no evidence of sciatic, peroneal, or tibial nerve mononeuropathy.  EMG/NCS of the bilateral upper extremities from 03/07/2023 was ordered by myself and reviewed and showed evidence of left median neuropathy at the carpal tunnel. No other evidence of peripheral mononeuropathy, radiculopathy, or plexopathy    Patient name: David Moon Patient MRN: [id number] Date of visit: 03/14/23

## 2023-03-21 ENCOUNTER — Ambulatory Visit: Payer: 59 | Admitting: Internal Medicine

## 2023-03-21 DIAGNOSIS — J309 Allergic rhinitis, unspecified: Secondary | ICD-10-CM

## 2023-03-28 ENCOUNTER — Encounter: Payer: 59 | Admitting: Sports Medicine

## 2023-04-14 ENCOUNTER — Encounter: Payer: 59 | Admitting: Sports Medicine

## 2023-08-30 ENCOUNTER — Telehealth: Payer: Self-pay

## 2023-08-30 ENCOUNTER — Ambulatory Visit (INDEPENDENT_AMBULATORY_CARE_PROVIDER_SITE_OTHER): Admitting: Physician Assistant

## 2023-08-30 DIAGNOSIS — M222X1 Patellofemoral disorders, right knee: Secondary | ICD-10-CM

## 2023-08-30 MED ORDER — DICLOFENAC SODIUM 75 MG PO TBEC: 75.0000 mg | DELAYED_RELEASE_TABLET | Freq: Two times a day (BID) | ORAL | 2 refills | Status: AC | PRN

## 2023-08-30 NOTE — Telephone Encounter (Signed)
 Please precert for right knee gel injection. Thanks

## 2023-08-30 NOTE — Progress Notes (Signed)
 Office Visit Note   Patient: David Moon           Date of Birth: Jan 20, 1991           MRN: [id number] Visit Date: 08/30/2023              Requested by: Annabell Key, [place]  E, PA [address] [place],  [place] [address] PCP: Annabell Key, [place]  E, PA   Assessment & Plan: Visit Diagnoses:  1. Patellofemoral pain syndrome of right knee     Plan: Impression is right knee patellofemoral syndrome.  Patient has not had long-lasting relief from cortisone injection nor from physical therapy.  Will provide her with a new PSA brace today.  We have also discussed getting approval for viscosupplementation injection as I feel her symptoms seem more arthritic in nature.  She will follow-up with us  once approved.  I sent in diclofenac to take in the meantime.  This patient is diagnosed with osteoarthritis of the knee(s).    Radiographs show evidence of joint space narrowing, osteophytes, subchondral sclerosis and/or subchondral cysts.  This patient has knee pain which interferes with functional and activities of daily living.    This patient has experienced inadequate response, adverse effects and/or intolerance with conservative treatments such as acetaminophen, NSAIDS, topical creams, physical therapy or regular exercise, knee bracing and/or weight loss.   This patient has experienced inadequate response or has a contraindication to intra articular steroid injections for at least 3 months.   This patient is not scheduled to have a total knee replacement within 6 months of starting treatment with viscosupplementation.   Follow-Up Instructions: Return for f/u once approved for visco injection.   Orders:  No orders of the defined types were placed in this encounter.  Meds ordered this encounter  Medications   diclofenac (VOLTAREN) 75 MG EC tablet    Sig: Take 1 tablet (75 mg total) by mouth 2 (two) times daily as needed.    Dispense:  60 tablet    Refill:  2      Procedures: No  procedures performed   Clinical Data: No additional findings.   Subjective: Chief Complaint  Patient presents with   Right Knee - Pain    HPI patient is a very pleasant 33 year old who comes in today with right knee pain.  Symptoms began several years ago without any known injury.  Patient walks for 10 hours at the time at work on a daily basis.  Pain is constant but worse after working all day and goes from a seated to standing position.  There are times where there is associated instability.  No mechanical symptoms.  Patient has taken gabapentin as well as over-the-counter pain medication without relief.  Patient has undergone multiple cortisone injections with good but temporary relief.  Last cortisone injection was 08/24/2023.  Patient has been to physical therapy in the past without any improvement in symptoms.  Patient has also tried what sounds like PSO bracing which did help until this was lost.  Recent MRI of the right knee shows parameniscal cyst within the lateral meniscus.  Review of Systems as detailed in HPI.  All others reviewed and are negative.   Objective: Vital Signs: There were no vitals taken for this visit.  Physical Exam well-developed well-nourished patient in no acute distress.  Alert and oriented x 3.  Ortho Exam right knee exam: No effusion.  Range of motion 0 to 125 degrees.  Very little lateral and medial joint line  tenderness.  Ligaments are stable.  Mild to moderate patellofemoral crepitus.  Neurovascularly intact distally.  Specialty Comments:  No specialty comments available.  Imaging: No new imaging   PMFS History: Patient Active Problem List   Diagnosis Date Noted   Chronic diarrhea 09/17/2022   Chronic urticaria 09/17/2022   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HCC)    Ovarian cyst     Family History  Problem Relation Age of Onset   Hypertension Mother    Atrial fibrillation Mother    Asthma Mother     Asthma Sister    Lung cancer Maternal Grandmother    Lung cancer Paternal Grandmother     Past Surgical History:  Procedure Laterality Date   ADRENALECTOMY     TONSILECTOMY, ADENOIDECTOMY, BILATERAL MYRINGOTOMY AND TUBES     Social History   Occupational History   Not on file  Tobacco Use   Smoking status: Former    Current packs/day: 0.00    Types: Cigarettes    Quit date: 06/28/2022    Years since quitting: 1.1   Smokeless tobacco: Never  Substance and Sexual Activity   Alcohol use: Not on file   Drug use: Not on file   Sexual activity: Not on file

## 2023-09-15 NOTE — Telephone Encounter (Signed)
 VOB submitted for Monovisc, right knee

## 2023-10-05 ENCOUNTER — Other Ambulatory Visit: Payer: Self-pay

## 2023-10-05 DIAGNOSIS — G8929 Other chronic pain: Secondary | ICD-10-CM

## 2023-10-18 ENCOUNTER — Ambulatory Visit: Admitting: Orthopaedic Surgery

## 2023-11-08 ENCOUNTER — Ambulatory Visit: Admitting: Orthopaedic Surgery

## 2023-11-28 ENCOUNTER — Encounter

## 2023-11-28 NOTE — Progress Notes (Deleted)
   Established Patient Pulmonology Office Visit   Subjective:  Patient ID: David Moon, adult    DOB: 1991/03/08  MRN: [id number]  CC: No chief complaint on file.   HPI 33 year old transgender male to male, prior smoker, prior cholecystectomy, adrenalectomy, tonsillectomy, adenoidectomy. Following Dr Brenna from our group prior for SOB.  Was thought to have small airway disease and was prescribed Breo Ellipta .  Symptoms improved after smoking cessation.  PFT: June 2024: No restrictive or obstructive lung disease.  DLCO normal.  Low FEV1 and FVC likely related to poor effort. CT chest: April 2024: Reviewed by me.  No significant parenchymal abnormalities.  Minor mediastinal lymphadenopathy.  Per radiologist read: 4 mm nodule in right lower lobe with mild air trapping. ECHO: None  {PULM QUESTIONNAIRES (Optional):33196}  ROS  {History (Optional):23778}  Current Outpatient Medications:    buPROPion (WELLBUTRIN SR) 150 MG 12 hr tablet, Take 150 mg by mouth 2 (two) times daily., Disp: , Rfl:    diclofenac  (VOLTAREN ) 75 MG EC tablet, Take 1 tablet (75 mg total) by mouth 2 (two) times daily as needed., Disp: 60 tablet, Rfl: 2   fluticasone  furoate-vilanterol (BREO ELLIPTA ) 200-25 MCG/ACT AEPB, Inhale 1 puff into the lungs daily., Disp: 60 each, Rfl: 3   gabapentin (NEURONTIN) 300 MG capsule, Take 300 mg by mouth 2 (two) times daily., Disp: , Rfl:    testosterone cypionate (DEPOTESTOSTERONE CYPIONATE) 200 MG/ML injection, Inject 200 mg into the muscle every 14 (fourteen) days., Disp: , Rfl:       Objective:  There were no vitals taken for this visit. {Pulm Vitals (Optional):32837}  PHYSICAL EXAM General: Distress/not in distress patient appearing ill/healthy Lungs: clear to auscultation bilaterally.  Heart: regular rate rhythm, no murmur appreciated.  Abdomen: non tender, non distended. Normal BS.  Neuro: axox***.  ***   Diagnostic Review:  {Labs (Optional):32838}     Assessment  & Plan:   Assessment & Plan   No orders of the defined types were placed in this encounter.     No follow-ups on file.   Demiana Crumbley, MD

## 2024-02-13 ENCOUNTER — Encounter: Payer: Self-pay | Admitting: Radiology
